# Patient Record
Sex: Male | Born: 1980 | Race: White | Hispanic: No | Marital: Married | Smoking: Never smoker
Health system: Southern US, Community
[De-identification: ages and names within clinical notes are randomized; demographics above are authoritative.]

## PROBLEM LIST (undated history)

## (undated) DIAGNOSIS — N200 Calculus of kidney: Secondary | ICD-10-CM

## (undated) HISTORY — PX: KNEE SURGERY: SHX244

---

## 2018-03-20 ENCOUNTER — Emergency Department: Payer: 59

## 2018-03-20 ENCOUNTER — Emergency Department
Admission: EM | Admit: 2018-03-20 | Discharge: 2018-03-20 | Disposition: A | Discharge status: Home / Self Care | Payer: 59 | Attending: Emergency Medicine | Admitting: Emergency Medicine

## 2018-03-20 ENCOUNTER — Encounter: Payer: Self-pay | Admitting: Emergency Medicine

## 2018-03-20 DIAGNOSIS — N200 Calculus of kidney: Secondary | ICD-10-CM

## 2018-03-20 DIAGNOSIS — R1012 Left upper quadrant pain: Secondary | ICD-10-CM | POA: Diagnosis present

## 2018-03-20 HISTORY — DX: Calculus of kidney: N20.0

## 2018-03-20 LAB — CBC
HEMATOCRIT: 41.6 % (ref 40.0–52.0)
HEMOGLOBIN: 14.8 g/dL (ref 13.0–18.0)
MCH: 30.4 pg (ref 26.0–34.0)
MCHC: 35.5 g/dL (ref 32.0–36.0)
MCV: 85.8 fL (ref 80.0–100.0)
Platelets: 326 10*3/uL (ref 150–440)
RBC: 4.85 MIL/uL (ref 4.40–5.90)
RDW: 12.7 % (ref 11.5–14.5)
WBC: 15.1 10*3/uL — ABNORMAL HIGH (ref 3.8–10.6)

## 2018-03-20 LAB — BASIC METABOLIC PANEL
ANION GAP: 10 (ref 5–15)
BUN: 16 mg/dL (ref 6–20)
CO2: 24 mmol/L (ref 22–32)
Calcium: 9.7 mg/dL (ref 8.9–10.3)
Chloride: 104 mmol/L (ref 101–111)
Creatinine, Ser: 1.23 mg/dL (ref 0.61–1.24)
Glucose, Bld: 112 mg/dL — ABNORMAL HIGH (ref 65–99)
POTASSIUM: 3.8 mmol/L (ref 3.5–5.1)
Sodium: 138 mmol/L (ref 135–145)

## 2018-03-20 LAB — URINALYSIS, COMPLETE (UACMP) WITH MICROSCOPIC
Bacteria, UA: NONE SEEN
Bilirubin Urine: NEGATIVE
GLUCOSE, UA: NEGATIVE mg/dL
Ketones, ur: 20 mg/dL — AB
Leukocytes, UA: NEGATIVE
Nitrite: NEGATIVE
PH: 7 (ref 5.0–8.0)
PROTEIN: 100 mg/dL — AB
Specific Gravity, Urine: 1.025 (ref 1.005–1.030)
Squamous Epithelial / LPF: NONE SEEN (ref 0–5)

## 2018-03-20 MED ORDER — MORPHINE SULFATE (PF) 2 MG/ML IV SOLN
2.0000 mg | Freq: Once | INTRAVENOUS | Status: AC
  Administered 2018-03-20: 2 mg via INTRAVENOUS
  Filled 2018-03-20: qty 1

## 2018-03-20 MED ORDER — OXYCODONE-ACETAMINOPHEN 5-325 MG PO TABS
2.0000 | ORAL_TABLET | Freq: Once | ORAL | Status: AC
  Administered 2018-03-20: 2 via ORAL
  Filled 2018-03-20: qty 2

## 2018-03-20 MED ORDER — KETOROLAC TROMETHAMINE 30 MG/ML IJ SOLN
30.0000 mg | Freq: Once | INTRAMUSCULAR | Status: AC
  Administered 2018-03-20: 30 mg via INTRAVENOUS
  Filled 2018-03-20: qty 1

## 2018-03-20 MED ORDER — FENTANYL CITRATE (PF) 100 MCG/2ML IJ SOLN
50.0000 ug | INTRAMUSCULAR | Status: AC | PRN
  Administered 2018-03-20 (×2): 50 ug via INTRAVENOUS
  Filled 2018-03-20 (×2): qty 2

## 2018-03-20 MED ORDER — SODIUM CHLORIDE 0.9 % IV SOLN
Freq: Once | INTRAVENOUS | Status: AC
  Administered 2018-03-20: 21:00:00 via INTRAVENOUS

## 2018-03-20 MED ORDER — ONDANSETRON 4 MG PO TBDP: 4.0000 mg | ORAL_TABLET | Freq: Three times a day (TID) | ORAL | 0 refills | Status: DC | PRN

## 2018-03-20 MED ORDER — OXYCODONE-ACETAMINOPHEN 7.5-325 MG PO TABS: 1.0000 | ORAL_TABLET | ORAL | 0 refills | Status: AC | PRN

## 2018-03-20 MED ORDER — TAMSULOSIN HCL 0.4 MG PO CAPS: 0.4000 mg | ORAL_CAPSULE | Freq: Every day | ORAL | 0 refills | Status: DC

## 2018-03-20 MED ORDER — HYDROMORPHONE HCL 1 MG/ML IJ SOLN
1.0000 mg | Freq: Once | INTRAMUSCULAR | Status: AC
  Administered 2018-03-20: 1 mg via INTRAVENOUS
  Filled 2018-03-20: qty 1

## 2018-03-20 MED ORDER — ONDANSETRON HCL 4 MG/2ML IJ SOLN
4.0000 mg | Freq: Once | INTRAMUSCULAR | Status: AC | PRN
  Administered 2018-03-20: 4 mg via INTRAVENOUS
  Filled 2018-03-20: qty 2

## 2018-03-20 NOTE — ED Provider Notes (Signed)
[facility] Emergency Department Provider Note       Time seen: ----------------------------------------- 8:59 PM on 03/20/2018 -----------------------------------------   I have reviewed the triage vital signs and the nursing notes.  HISTORY   Chief Complaint Flank Pain    HPI Dustin Klein is a 37 y.o. male with a history of kidney stones who presents to the ED for left flank pain that started 2 hours ago.  Patient has pain similar in the past from kidney stones.  He complains of dysuria.  He denies any other specific abdominal pain, has nausea but no vomiting or diarrhea.  Past Medical History:  Diagnosis Date  . Nephrolithiasis     There are no active problems to display for this patient.   History reviewed. No pertinent surgical history.  Allergies Patient has no known allergies.  Social History Social History   Tobacco Use  . Smoking status: Never Smoker  Substance Use Topics  . Alcohol use: Not Currently    Frequency: Never  . Drug use: Not Currently    Review of Systems Constitutional: Negative for fever. Cardiovascular: Negative for chest pain. Respiratory: Negative for shortness of breath. Gastrointestinal: Positive for flank pain Genitourinary: Positive for dysuria Musculoskeletal: Negative for back pain. Skin: Negative for rash. Neurological: Negative for headaches, focal weakness or numbness.  All systems negative/normal/unremarkable except as stated in the HPI  ____________________________________________   PHYSICAL EXAM:  VITAL SIGNS: ED Triage Vitals  Enc Vitals Group     BP --      Pulse --      Resp --      Temp --      Temp src --      SpO2 --      Weight 03/20/18 1805 150 lb (68 kg)     Height 03/20/18 1805 6' (1.829 m)     Head Circumference --      Peak Flow --      Pain Score 03/20/18 1847 10     Pain Loc --      Pain Edu? --      Excl. in GC? --    Constitutional: Alert and oriented.   Mild distress Eyes: Conjunctivae are normal. Normal extraocular movements. Cardiovascular: Normal rate, regular rhythm. No murmurs, rubs, or gallops. Respiratory: Normal respiratory effort without tachypnea nor retractions. Breath sounds are clear and equal bilaterally. No wheezes/rales/rhonchi. Gastrointestinal: Left flank tenderness, normal bowel sounds. Musculoskeletal: Nontender with normal range of motion in extremities. No lower extremity tenderness nor edema. Neurologic:  Normal speech and language. No gross focal neurologic deficits are appreciated.  Skin:  Skin is warm, dry and intact. No rash noted. Psychiatric: Mood and affect are normal. Speech and behavior are normal.  ____________________________________________  ED COURSE:  As part of my medical decision making, I reviewed the following data within the electronic MEDICAL RECORD NUMBER History obtained from family if available, nursing notes, old chart and ekg, as well as notes from prior ED visits. Patient presented for flank pain, we will assess with labs and imaging as indicated at this time.   Procedures ____________________________________________   LABS (pertinent positives/negatives)  Labs Reviewed  BASIC METABOLIC PANEL - Abnormal; Notable for the following components:      Result Value   Glucose, Bld 112 (*)    All other components within normal limits  CBC - Abnormal; Notable for the following components:   WBC 15.1 (*)    All other components within normal limits  URINALYSIS,  COMPLETE (UACMP) WITH MICROSCOPIC    RADIOLOGY  CT renal protocol IMPRESSION: 1. 1-2 mm stone at the left ureterovesicular junction causes mild left hydronephrosis. 2. No other acute abnormality.  No intrarenal stones. 3. Moderate increased stool in the rectum. Mild increased stool throughout the colon. No bowel inflammation.  ____________________________________________  DIFFERENTIAL DIAGNOSIS   Renal colic, UTI,  pyelonephritis, muscle strain  FINAL ASSESSMENT AND PLAN  Renal colic   Plan: The patient had presented for flank pain. Patient's labs were not concerning. Patient's imaging did reveal a distal left ureteral stone.  Pain is currently under control, he is stable for outpatient follow-up with pain medicine, antiemetics and Flomax.   Ulice Dash, MD   Note: This note was generated in part or whole with voice recognition software. Voice recognition is usually quite accurate but there are transcription errors that can and very often do occur. I apologize for any typographical errors that were not detected and corrected.     Emily Filbert, MD 03/20/18 2106

## 2018-03-20 NOTE — ED Triage Notes (Signed)
Pt arrived via pOV c/o left flank pain that started about 2 hours ago, similar to prior pain from kidney stones.   Pt c/o dysuria. Pt denies any abdominal pain.

## 2018-09-21 ENCOUNTER — Other Ambulatory Visit: Payer: Self-pay | Admitting: Family Medicine

## 2018-09-21 ENCOUNTER — Ambulatory Visit: Payer: Self-pay

## 2018-09-21 DIAGNOSIS — R0781 Pleurodynia: Secondary | ICD-10-CM

## 2020-05-21 ENCOUNTER — Other Ambulatory Visit: Payer: Self-pay

## 2020-05-21 ENCOUNTER — Emergency Department (HOSPITAL_COMMUNITY): Payer: 59

## 2020-05-21 ENCOUNTER — Emergency Department (HOSPITAL_COMMUNITY)
Admission: EM | Admit: 2020-05-21 | Discharge: 2020-05-21 | Disposition: A | Discharge status: Home / Self Care | Payer: 59 | Attending: Emergency Medicine | Admitting: Emergency Medicine

## 2020-05-21 DIAGNOSIS — U071 COVID-19: Secondary | ICD-10-CM | POA: Insufficient documentation

## 2020-05-21 DIAGNOSIS — J939 Pneumothorax, unspecified: Secondary | ICD-10-CM | POA: Diagnosis not present

## 2020-05-21 DIAGNOSIS — R509 Fever, unspecified: Secondary | ICD-10-CM | POA: Diagnosis present

## 2020-05-21 LAB — SARS CORONAVIRUS 2 BY RT PCR (HOSPITAL ORDER, PERFORMED IN ~~LOC~~ HOSPITAL LAB): SARS Coronavirus 2: POSITIVE — AB

## 2020-05-21 LAB — BASIC METABOLIC PANEL
Anion gap: 6 (ref 5–15)
BUN: 13 mg/dL (ref 6–20)
CO2: 27 mmol/L (ref 22–32)
Calcium: 8.6 mg/dL — ABNORMAL LOW (ref 8.9–10.3)
Chloride: 105 mmol/L (ref 98–111)
Creatinine, Ser: 0.95 mg/dL (ref 0.61–1.24)
GFR calc Af Amer: >60 mL/min (ref >60)
GFR calc non Af Amer: >60 mL/min (ref >60)
Glucose, Bld: 98 mg/dL (ref 70–99)
Potassium: 3.3 mmol/L — ABNORMAL LOW (ref 3.5–5.1)
Sodium: 138 mmol/L (ref 135–145)

## 2020-05-21 LAB — CBC WITH DIFFERENTIAL/PLATELET
Abs Immature Granulocytes: 0.04 10*3/uL (ref 0.00–0.07)
Basophils Absolute: 0 10*3/uL (ref 0.0–0.1)
Basophils Relative: 0 %
Eosinophils Absolute: 0 10*3/uL (ref 0.0–0.5)
Eosinophils Relative: 0 %
HCT: 38 % — ABNORMAL LOW (ref 39.0–52.0)
Hemoglobin: 13.1 g/dL (ref 13.0–17.0)
Immature Granulocytes: 1 %
Lymphocytes Relative: 13 %
Lymphs Abs: 0.7 10*3/uL (ref 0.7–4.0)
MCH: 30.1 pg (ref 26.0–34.0)
MCHC: 34.5 g/dL (ref 30.0–36.0)
MCV: 87.4 fL (ref 80.0–100.0)
Monocytes Absolute: 0.5 10*3/uL (ref 0.1–1.0)
Monocytes Relative: 9 %
Neutro Abs: 4 10*3/uL (ref 1.7–7.7)
Neutrophils Relative %: 77 %
Platelets: 212 10*3/uL (ref 150–400)
RBC: 4.35 MIL/uL (ref 4.22–5.81)
RDW: 12.1 % (ref 11.5–15.5)
WBC: 5.3 10*3/uL (ref 4.0–10.5)
nRBC: 0 % (ref 0.0–0.2)

## 2020-05-21 MED ORDER — SODIUM CHLORIDE (PF) 0.9 % IJ SOLN
INTRAMUSCULAR | Status: AC
  Filled 2020-05-21: qty 50

## 2020-05-21 MED ORDER — KETOROLAC TROMETHAMINE 30 MG/ML IJ SOLN
30.0000 mg | Freq: Once | INTRAMUSCULAR | Status: AC
  Administered 2020-05-21: 30 mg via INTRAMUSCULAR
  Filled 2020-05-21: qty 1

## 2020-05-21 MED ORDER — SODIUM CHLORIDE 0.9 % IV BOLUS
1000.0000 mL | Freq: Once | INTRAVENOUS | Status: AC
  Administered 2020-05-21: 1000 mL via INTRAVENOUS

## 2020-05-21 MED ORDER — FLUTICASONE PROPIONATE 50 MCG/ACT NA SUSP
2.0000 | Freq: Every day | NASAL | 0 refills | Status: DC
Start: 2020-05-21 — End: 2024-06-23

## 2020-05-21 MED ORDER — IOHEXOL 350 MG/ML SOLN
100.0000 mL | Freq: Once | INTRAVENOUS | Status: AC | PRN
  Administered 2020-05-21: 100 mL via INTRAVENOUS

## 2020-05-21 MED ORDER — BENZONATATE 100 MG PO CAPS: 100.0000 mg | ORAL_CAPSULE | Freq: Three times a day (TID) | ORAL | 0 refills | Status: DC

## 2020-05-21 NOTE — Discharge Instructions (Signed)
Tylenol and ibuprofen as needed for body aches  I have written you a medication for your cough.  It is important that you have a follow-up chest x-ray in 1 to 2 days to monitor the size of your pneumothorax.  If you have any new or worsening symptoms please seek reevaluation

## 2020-05-21 NOTE — ED Notes (Signed)
Patient back from CT.

## 2020-05-21 NOTE — ED Triage Notes (Addendum)
Pt to ED with c.o of sinus pressure/pain/congestion for 12 days but loss of smell/ taste/ and fever since yestereday. Pt fever since yesterday has been100 F and complaining of sob. Pt states has been coughing up lime green sputum for 12 days and also chest tightness. Pt was seen virtually at some point in the past 12 days placed on prednisone for 2 days and states felt better but since off has progressively gotten worse. Pt was diagnosed with sinusitis yesterday virtually and placed on Augmentin, but has not started Abx. Pt worried about pneumonia and decided to come here for evaluation .

## 2020-05-21 NOTE — ED Notes (Signed)
Patient transported to CT 

## 2020-05-21 NOTE — ED Provider Notes (Signed)
[facility]-EMERGENCY DEPT Provider Note   CSN: [id number] Arrival date & time: 05/21/20  [id number]    History Chief Complaint  Patient presents with  . Fever    Dustin NobleDerrick Klein is a 39 y.o. male with no significant past medical history who presents for evaluation of upper respiratory symptoms.  Patient states he had sinus pressure, congestion over the last 12 days.  States he is lost his sense of taste and smell yesterday.  Did telemedicine visit yesterday and placed on Augmentin.  Patient states he is now developed cough which was initially productive of green sputum however is no longer productive.  States he does get some chest tightness when he coughs.  Was originally placed on prednisone taper initial onset of symptoms which patient states resolved his symptoms however upon completion his symptoms returned.  States he has had generalized body aches and pains.  No Covid contacts that he knows of.  He did not receive the Covid vaccine.  Temp max 100.0 yesterday.  He has not taken anything for his symptoms.  He has not started on his Augmentin for sinusitis.  Denies lightheadedness, dizziness, sore throat, neck pain, neck stiffness, exertional chest pain or hemoptysis, abdominal pain, diarrhea, dysuria, lateral leg swelling, redness or warmth.  No prior history of PE or DVT.  No recent surgery or immobilization.  No known sick contacts.  History obtained from patient and past medical records.  No interpreter is used.  HPI     Past Medical History:  Diagnosis Date  . Nephrolithiasis     There are no problems to display for this patient.   No past surgical history on file.     No family history on file.  Social History   Tobacco Use  . Smoking status: Never Smoker  Vaping Use  . Vaping Use: Never used  Substance Use Topics  . Alcohol use: Not Currently  . Drug use: Not Currently    Home Medications Prior to Admission medications   Medication Sig Start  Date End Date Taking? Authorizing Provider  amoxicillin-clavulanate (AUGMENTIN) 875-125 MG tablet Take 1 tablet by mouth 2 (two) times daily. 05/20/20  Yes [provider]  benzonatate (TESSALON) 100 MG capsule Take 1 capsule (100 mg total) by mouth every 8 (eight) hours. 05/21/20   Yoshi Mancillas A, PA-C  fluticasone (FLONASE) 50 MCG/ACT nasal spray Place 2 sprays into both nostrils daily. 05/21/20   Nastassja Witkop A, PA-C  predniSONE (DELTASONE) 10 MG tablet Take 10-40 mg by mouth See admin instructions. Take 40mg  on day 1, then 20mg  on days 2-4 and 10mg  on day 5. Patient not taking: Reported on 05/21/2020 05/16/20   [provider]    Allergies    Morphine and related  Review of Systems   Review of Systems  Constitutional: Positive for activity change, appetite change, fatigue and fever. Negative for chills and diaphoresis.  HENT: Positive for congestion, postnasal drip, rhinorrhea and sinus pressure. Negative for ear pain, facial swelling, hearing loss, nosebleeds, sinus pain, sneezing, sore throat, trouble swallowing and voice change.   Respiratory: Negative.   Cardiovascular: Negative.   Gastrointestinal: Negative.   Genitourinary: Negative.   Musculoskeletal: Positive for myalgias. Negative for arthralgias, gait problem, neck pain and neck stiffness.  Skin: Negative.   Neurological: Positive for weakness (Generalized weakness).  All other systems reviewed and are negative.   Physical Exam Updated Vital Signs BP 123/84 (BP Location: Right Arm)   Pulse 68   Temp 99.5  F (37.5 C) (Oral)   Resp 17   Ht 6' (1.829 m)   Wt 70.3 kg   SpO2 100%   BMI 21.02 kg/m   Physical Exam Vitals and nursing note reviewed.  Constitutional:      General: He is not in acute distress.    Appearance: He is not ill-appearing, toxic-appearing or diaphoretic.  HENT:     Head: Normocephalic and atraumatic.     Jaw: There is normal jaw occlusion.     Right Ear: Tympanic  membrane, ear canal and external ear normal. There is no impacted cerumen. No hemotympanum. Tympanic membrane is not injected, scarred, perforated, erythematous, retracted or bulging.     Left Ear: Tympanic membrane, ear canal and external ear normal. There is no impacted cerumen. No hemotympanum. Tympanic membrane is not injected, scarred, perforated, erythematous, retracted or bulging.     Ears:     Comments: No Mastoid tenderness.    Nose:     Comments: Clear rhinorrhea and congestion to bilateral nares.  No sinus tenderness.    Mouth/Throat:     Comments: Posterior oropharynx clear.  Mucous membranes moist.  Tonsils without erythema or exudate.  Uvula midline without deviation.  No evidence of PTA or RPA.  No drooling, dysphasia or trismus.  Phonation normal. Neck:     Trachea: Trachea and phonation normal.     Meningeal: Brudzinski's sign and Kernig's sign absent.     Comments: No Neck stiffness or neck rigidity.  No meningismus.  No cervical lymphadenopathy. Cardiovascular:     Comments: No murmurs rubs or gallops. Pulmonary:     Comments: Clear to auscultation bilaterally without wheeze, rhonchi or rales.  No accessory muscle usage.  Able speak in full sentences. Abdominal:     Comments: Soft, nontender without rebound or guarding.  No CVA tenderness.  Musculoskeletal:     Comments: Moves all 4 extremities without difficulty.  Lower extremities without edema, erythema or warmth.  Skin:    Comments: Brisk capillary refill.  No rashes or lesions.  Neurological:     Mental Status: He is alert.     Comments: Ambulatory in department without difficulty.  Cranial nerves II through XII grossly intact.  No facial droop.  No aphasia.     ED Results / Procedures / Treatments   Labs (all labs ordered are listed, but only abnormal results are displayed) Labs Reviewed  SARS CORONAVIRUS 2 BY RT PCR (HOSPITAL ORDER, PERFORMED IN [facility]) - Abnormal; Notable for the following  components:      Result Value   SARS Coronavirus 2 POSITIVE (*)    All other components within normal limits  CBC WITH DIFFERENTIAL/PLATELET - Abnormal; Notable for the following components:   HCT 38.0 (*)    All other components within normal limits  BASIC METABOLIC PANEL - Abnormal; Notable for the following components:   Potassium 3.3 (*)    Calcium 8.6 (*)    All other components within normal limits    EKG EKG Interpretation  Date/Time:  Monday May 21 2020 06:56:51 EDT Ventricular Rate:  76 PR Interval:    QRS Duration: 103 QT Interval:  365 QTC Calculation: 411 R Axis:   96 Text Interpretation: Sinus rhythm Consider right ventricular hypertrophy ST elev, probable normal early repol pattern No old tracing to compare Confirmed by Meridee Score [phone]) on 05/21/2020 7:23:35 AM   Radiology CT Angio Chest PE W/Cm &/Or Wo Cm  Addendum Date: 05/21/2020   ADDENDUM  REPORT: 05/21/2020 12:13 ADDENDUM: There is an apparent minimal apical pneumothorax on the left, also appreciable on chest radiograph obtained earlier in the day. This information was communicated directly to referring provider. Electronically Signed   By: Lowella Grip III M.D.   On: 05/21/2020 12:13   Result Date: 05/21/2020 CLINICAL DATA:  Shortness of breath EXAM: CT ANGIOGRAPHY CHEST WITH CONTRAST TECHNIQUE: Multidetector CT imaging of the chest was performed using the standard protocol during bolus administration of intravenous contrast. Multiplanar CT image reconstructions and MIPs were obtained to evaluate the vascular anatomy. CONTRAST:  129mL OMNIPAQUE IOHEXOL 350 MG/ML SOLN COMPARISON:  Chest radiograph May 21, 2020 FINDINGS: Cardiovascular: There is no demonstrable pulmonary embolus. There is no thoracic aortic aneurysm. No dissection evident. Note that the contrast bolus within the aorta is not sufficient for confident dissection assessment. Visualized great vessels appear unremarkable. Note that the right  innominate and left common carotid arteries arise as a common trunk, an anatomic variant. There is no pericardial effusion or pericardial thickening. Mediastinum/Nodes: Visualized thyroid appears unremarkable. There are scattered subcentimeter lymph nodes without adenopathy evident by size criteria. No esophageal lesions are appreciable. Lungs/Pleura: There is patchy airspace opacity at multiple sites with the greatest degree of airspace opacity in each lower lobe but also present in portions of the right upper lobe and lingula. Several somewhat nodular appearing opacities are present in the lungs bilaterally which range in size from 4 mm to slightly greater than 1 cm. No cavitation evident. No pleural effusions are evident. Upper Abdomen: Visualized upper abdominal structures appear unremarkable. Musculoskeletal: No blastic or lytic bone lesions. No chest wall lesions are evident. Review of the MIP images confirms the above findings. IMPRESSION: 1. No demonstrable pulmonary embolus. No thoracic aortic aneurysm. No dissection evident. Note that the contrast bolus in the aorta is not sufficient for confident dissection assessment. 2. Multifocal pneumonia, most severe in the lower lobes. Multiple nodular opacities are seen bilaterally which may represent smaller foci of pneumonia. Atypical presentation of neoplasm possible; advise follow-up noncontrast enhanced CT examination after treatment for pneumonia. With respect to the appearance, correlation with COVID-19 status advised. Note that there is no appreciable pleural effusion or evidence of cavitation. 3.  No evident adenopathy. Electronically Signed: By: Lowella Grip III M.D. On: 05/21/2020 12:01   DG Chest Portable 1 View  Result Date: 05/21/2020 CLINICAL DATA:  COVID and shortness of breath EXAM: PORTABLE CHEST 1 VIEW COMPARISON:  09/21/2018 FINDINGS: Vague opacities in the left lung. Trace left apical pneumothorax. No edema, effusion, or pneumothorax.  Critical Value/emergent results were called by telephone at the time of interpretation on 05/21/2020 at 7:37 am to provider [facility] , who verbally acknowledged these results. IMPRESSION: 1. Suspect early infiltrates in the left lung. 2. Trace left apical pneumothorax. Electronically Signed   By: Monte Fantasia M.D.   On: 05/21/2020 07:37    Procedures Procedures (including critical care time)  Medications Ordered in ED Medications  ketorolac (TORADOL) 30 MG/ML injection 30 mg (30 mg Intramuscular Given 05/21/20 0657)  sodium chloride 0.9 % bolus 1,000 mL (0 mLs Intravenous Stopped 05/21/20 1010)  sodium chloride (PF) 0.9 % injection (  Given by Other 05/21/20 1151)  iohexol (OMNIPAQUE) 350 MG/ML injection 100 mL (100 mLs Intravenous Contrast Given 05/21/20 1144)    ED Course  I have reviewed the triage vital signs and the nursing notes.  Pertinent labs & imaging results that were available during my care of the patient were reviewed by  me and considered in my medical decision making (see chart for details).  39 year old male presents for evaluation of upper respiratory complaints.  He is afebrile, nonseptic, not ill-appearing here.  Did not receive Covid vaccine.  No known Covid exposures.  His heart and lungs are clear.  Does have some tenderness to his bilateral maxillary sinuses however no purulent discharge.  Abdomen soft, nontender.  He does have myalgias.  Patient's Covid test here is positive.  He does have some chest tightness and shortness of breath when he coughs however none while he is sitting.  Plan on chest x-ray, EKG.   EKG without STEMI  Chest x-ray with likely early infiltrates, possible trace left apical pneumothorax.  Discussed with radiology.  They recommend reimaging in 2 hours to assess for change. CBC without leukocytosis BMP with mild hypokalemia no additional abnormality COVID POSITIVE   Patient reassessed.  He continues have oxygen saturation 100% on room air  without tachycardia, tachypnea.  CT scan does show likely Covid pneumonia with minimal periapical pneumothorax on left.  Consult with Alycia Rossetti with CT surgery.  She recommends if patient is stable vital signs he may follow-up outpatient in 1 to 2 days for repeat chest x-ray.  She recommend strict return precautions.  Discussed imaging findings with patient.  He continues have stable vital signs.  Ambulatory with out hypoxia.  Discussed with medic management at home and close follow-up.  He will return for new or worsening symptoms.   The patient has been appropriately medically screened and/or stabilized in the ED. I have low suspicion for any other emergent medical condition which would require further screening, evaluation or treatment in the ED or require inpatient management.  Patient is hemodynamically stable and in no acute distress.  Patient able to ambulate in department prior to ED.  Evaluation does not show acute pathology that would require ongoing or additional emergent interventions while in the emergency department or further inpatient treatment.  I have discussed the diagnosis with the patient and answered all questions.  Pain is been managed while in the emergency department and patient has no further complaints prior to discharge.  Patient is comfortable with plan discussed in room and is stable for discharge at this time.  I have discussed strict return precautions for returning to the emergency department.  Patient was encouraged to follow-up with PCP/specialist refer to at discharge.   MDM Rules/Calculators/A&P                          Dustin Klein was evaluated in Emergency Department on 05/21/2020 for the symptoms described in the history of present illness. He was evaluated in the context of the global COVID-19 pandemic, which necessitated consideration that the patient might be at risk for infection with the SARS-CoV-2 virus that causes COVID-19. Institutional protocols and  algorithms that pertain to the evaluation of patients at risk for COVID-19 are in a state of rapid change based on information released by regulatory bodies including the CDC and federal and state organizations. These policies and algorithms were followed during the patient's care in the ED. Final Clinical Impression(s) / ED Diagnoses Final diagnoses:  COVID-19  Pneumothorax, unspecified type    Rx / DC Orders ED Discharge Orders         Ordered    benzonatate (TESSALON) 100 MG capsule  Every 8 hours     Discontinue  Reprint     05/21/20 1312    fluticasone (  FLONASE) 50 MCG/ACT nasal spray  Daily     Discontinue  Reprint     05/21/20 1312           Dmario Russom A, PA-C 05/21/20 1315    Terrilee Files, MD 05/21/20 1726

## 2020-05-23 ENCOUNTER — Emergency Department (HOSPITAL_COMMUNITY): Payer: 59

## 2020-05-23 ENCOUNTER — Encounter (HOSPITAL_COMMUNITY): Payer: Self-pay | Admitting: Emergency Medicine

## 2020-05-23 ENCOUNTER — Other Ambulatory Visit: Payer: Self-pay

## 2020-05-23 ENCOUNTER — Emergency Department (HOSPITAL_COMMUNITY)
Admission: EM | Admit: 2020-05-23 | Discharge: 2020-05-23 | Disposition: A | Discharge status: Home / Self Care | Payer: 59 | Attending: Emergency Medicine | Admitting: Emergency Medicine

## 2020-05-23 DIAGNOSIS — U071 COVID-19: Secondary | ICD-10-CM | POA: Diagnosis not present

## 2020-05-23 DIAGNOSIS — J1282 Pneumonia due to coronavirus disease 2019: Secondary | ICD-10-CM | POA: Insufficient documentation

## 2020-05-23 DIAGNOSIS — R05 Cough: Secondary | ICD-10-CM | POA: Diagnosis present

## 2020-05-23 MED ORDER — IBUPROFEN 800 MG PO TABS: 800.0000 mg | ORAL_TABLET | Freq: Three times a day (TID) | ORAL | 0 refills | Status: DC

## 2020-05-23 NOTE — ED Notes (Signed)
Discharge paperwork reviewed with pt. Pt with no questions or concerns at this time, pt ambulatory on room air to ED exit.

## 2020-05-23 NOTE — ED Provider Notes (Signed)
[facility]-EMERGENCY DEPT Provider Note   CSN: [id number] Arrival date & time: 05/23/20  0744     History Chief Complaint  Patient presents with  . Follow-up for + COVID    Dustin Klein is a 39 y.o. male.  HPI   Patient presented to the ED to follow-up on an abnormal chest x-ray he recently had.  Patient was seen in the emergency room 2 days ago for symptoms with cough, body aches and congestion.  Patient was diagnosed with Covid 19.  He also had an abnormality on his chest x-ray that suggested a pneumothorax.  This was confirmed with CT scan.  The pneumothorax was small, cardiothoracic surgery was consulted and they just recommended follow-up chest x-ray in 2 days without any further intervention.  Patient states he continues to cough.  He has aches and pains in his back and his chest.  He feels that he still having fevers.  He has been taking Tylenol and ibuprofen which tend to help with the symptoms return as far as his aches and pains.  His symptoms started about 2 weeks ago.  Past Medical History:  Diagnosis Date  . Nephrolithiasis     There are no problems to display for this patient.   History reviewed. No pertinent surgical history.     No family history on file.  Social History   Tobacco Use  . Smoking status: Never Smoker  Vaping Use  . Vaping Use: Never used  Substance Use Topics  . Alcohol use: Not Currently  . Drug use: Not Currently    Home Medications Prior to Admission medications   Medication Sig Start Date End Date Taking? Authorizing Provider  amoxicillin-clavulanate (AUGMENTIN) 875-125 MG tablet Take 1 tablet by mouth 2 (two) times daily. 05/20/20   [provider]  benzonatate (TESSALON) 100 MG capsule Take 1 capsule (100 mg total) by mouth every 8 (eight) hours. 05/21/20   Henderly, Britni A, PA-C  fluticasone (FLONASE) 50 MCG/ACT nasal spray Place 2 sprays into both nostrils daily. 05/21/20   Henderly, Britni A,  PA-C  ibuprofen (ADVIL) 800 MG tablet Take 1 tablet (800 mg total) by mouth 3 (three) times daily. 05/23/20   Linwood Dibbles, MD  predniSONE (DELTASONE) 10 MG tablet Take 10-40 mg by mouth See admin instructions. Take 40mg  on day 1, then 20mg  on days 2-4 and 10mg  on day 5. Patient not taking: Reported on 05/21/2020 05/16/20   [provider]    Allergies    Morphine and related  Review of Systems   Review of Systems  All other systems reviewed and are negative.   Physical Exam Updated Vital Signs BP 115/85 (BP Location: Left Arm)   Pulse (!) 57   Temp 98.2 F (36.8 C) (Oral)   Resp 16   Ht 1.829 m (6')   Wt 70.3 kg   SpO2 99%   BMI 21.02 kg/m   Physical Exam Vitals and nursing note reviewed.  Constitutional:      General: He is not in acute distress.    Appearance: He is well-developed.  HENT:     Head: Normocephalic and atraumatic.     Right Ear: External ear normal.     Left Ear: External ear normal.  Eyes:     General: No scleral icterus.       Right eye: No discharge.        Left eye: No discharge.     Conjunctiva/sclera: Conjunctivae normal.  Neck:  Trachea: No tracheal deviation.  Cardiovascular:     Rate and Rhythm: Normal rate and regular rhythm.  Pulmonary:     Effort: Pulmonary effort is normal. No respiratory distress.     Breath sounds: Normal breath sounds. No stridor. No wheezing or rales.  Abdominal:     General: Bowel sounds are normal. There is no distension.     Palpations: Abdomen is soft.     Tenderness: There is no abdominal tenderness. There is no guarding or rebound.  Musculoskeletal:        General: No swelling, tenderness or deformity.     Cervical back: Neck supple.  Skin:    General: Skin is warm and dry.     Findings: No rash.  Neurological:     Mental Status: He is alert.     Cranial Nerves: Cranial nerve deficit: no gross deficits.     Sensory: No sensory deficit.     Motor: No abnormal muscle tone or seizure activity.       Coordination: Coordination normal.     ED Results / Procedures / Treatments   Labs (all labs ordered are listed, but only abnormal results are displayed) Labs Reviewed - No data to display  EKG None  Radiology DG Chest 2 View  Result Date: 05/23/2020 CLINICAL DATA:  COVID positive, shortness of breath EXAM: CHEST - 2 VIEW COMPARISON:  05/21/2020 FINDINGS: The heart size and mediastinal contours are within normal limits. No significant change in scattered bilateral heterogeneous airspace opacities, most conspicuous in the left midlung. The visualized skeletal structures are unremarkable. IMPRESSION: No significant change in scattered bilateral heterogeneous airspace opacities, most conspicuous in the left midlung, consistent with multifocal infection and COVID-19 diagnosis. Electronically Signed   By: Lauralyn Primes M.D.   On: 05/23/2020 08:41   CT Angio Chest PE W/Cm &/Or Wo Cm  Addendum Date: 05/21/2020   ADDENDUM REPORT: 05/21/2020 12:13 ADDENDUM: There is an apparent minimal apical pneumothorax on the left, also appreciable on chest radiograph obtained earlier in the day. This information was communicated directly to referring provider. Electronically Signed   By: Bretta Bang III M.D.   On: 05/21/2020 12:13   Result Date: 05/21/2020 CLINICAL DATA:  Shortness of breath EXAM: CT ANGIOGRAPHY CHEST WITH CONTRAST TECHNIQUE: Multidetector CT imaging of the chest was performed using the standard protocol during bolus administration of intravenous contrast. Multiplanar CT image reconstructions and MIPs were obtained to evaluate the vascular anatomy. CONTRAST:  OMNIPAQUE IOHEXOL 350 MG/ML SOLN COMPARISON:  Chest radiograph May 21, 2020 FINDINGS: Cardiovascular: There is no demonstrable pulmonary embolus. There is no thoracic aortic aneurysm. No dissection evident. Note that the contrast bolus within the aorta is not sufficient for confident dissection assessment. Visualized great  vessels appear unremarkable. Note that the right innominate and left common carotid arteries arise as a common trunk, an anatomic variant. There is no pericardial effusion or pericardial thickening. Mediastinum/Nodes: Visualized thyroid appears unremarkable. There are scattered subcentimeter lymph nodes without adenopathy evident by size criteria. No esophageal lesions are appreciable. Lungs/Pleura: There is patchy airspace opacity at multiple sites with the greatest degree of airspace opacity in each lower lobe but also present in portions of the right upper lobe and lingula. Several somewhat nodular appearing opacities are present in the lungs bilaterally which range in size from 4 mm to slightly greater than 1 cm. No cavitation evident. No pleural effusions are evident. Upper Abdomen: Visualized upper abdominal structures appear unremarkable. Musculoskeletal: No blastic or lytic bone  lesions. No chest wall lesions are evident. Review of the MIP images confirms the above findings. IMPRESSION: 1. No demonstrable pulmonary embolus. No thoracic aortic aneurysm. No dissection evident. Note that the contrast bolus in the aorta is not sufficient for confident dissection assessment. 2. Multifocal pneumonia, most severe in the lower lobes. Multiple nodular opacities are seen bilaterally which may represent smaller foci of pneumonia. Atypical presentation of neoplasm possible; advise follow-up noncontrast enhanced CT examination after treatment for pneumonia. With respect to the appearance, correlation with COVID-19 status advised. Note that there is no appreciable pleural effusion or evidence of cavitation. 3.  No evident adenopathy. Electronically Signed: By: Bretta Bang III M.D. On: 05/21/2020 12:01    Procedures Procedures (including critical care time)  Medications Ordered in ED Medications - No data to display  ED Course  I have reviewed the triage vital signs and the nursing notes.  Pertinent labs  & imaging results that were available during my care of the patient were reviewed by me and considered in my medical decision making (see chart for details).    MDM Rules/Calculators/A&P                          Patient's repeat chest x-ray does not show evidence of pneumothorax per radiology.  This is either resolved or is small enough that is not visible on chest x-ray at this time.  Regarding the patient's Covid, he is well outside any treatment window.  Fortunately he is not hypoxic.  No indications for hospitalization.  Recommend continued supportive care.  Colbe Viviano was evaluated in Emergency Department on 05/23/2020 for the symptoms described in the history of present illness. He was evaluated in the context of the global COVID-19 pandemic, which necessitated consideration that the patient might be at risk for infection with the SARS-CoV-2 virus that causes COVID-19. Institutional protocols and algorithms that pertain to the evaluation of patients at risk for COVID-19 are in a state of rapid change based on information released by regulatory bodies including the CDC and federal and state organizations. These policies and algorithms were followed during the patient's care in the ED.  Final Clinical Impression(s) / ED Diagnoses Final diagnoses:  Pneumonia due to COVID-19 virus    Rx / DC Orders ED Discharge Orders         Ordered    ibuprofen (ADVIL) 800 MG tablet  3 times daily     Discontinue  Reprint     05/23/20 0950           Linwood Dibbles, MD 05/23/20 1000

## 2020-05-23 NOTE — Discharge Instructions (Addendum)
Continue the medications to help with your covid symptoms.  Take the ibuprofen to help with the aches and pains.  You can continue to take tylenol.   The ptx that you had previously, the radiologist did not see today on xray.  That should continue to heal on its own.

## 2020-05-23 NOTE — ED Triage Notes (Signed)
Patient presents for a follow-up regarding his + CVOID results for another CXR, states he was told to come back to the ED in 2 days. Endorses cough, SOB. States he has been taking his medications as prescribed.

## 2021-09-29 IMAGING — CR DG CHEST 2V
2 series · 2 of 2 positions shown · non-contrast
Comparison: 05/21/2020

CLINICAL DATA: COVID positive, shortness of breath

EXAM:
CHEST - 2 VIEW

[w chest pa]
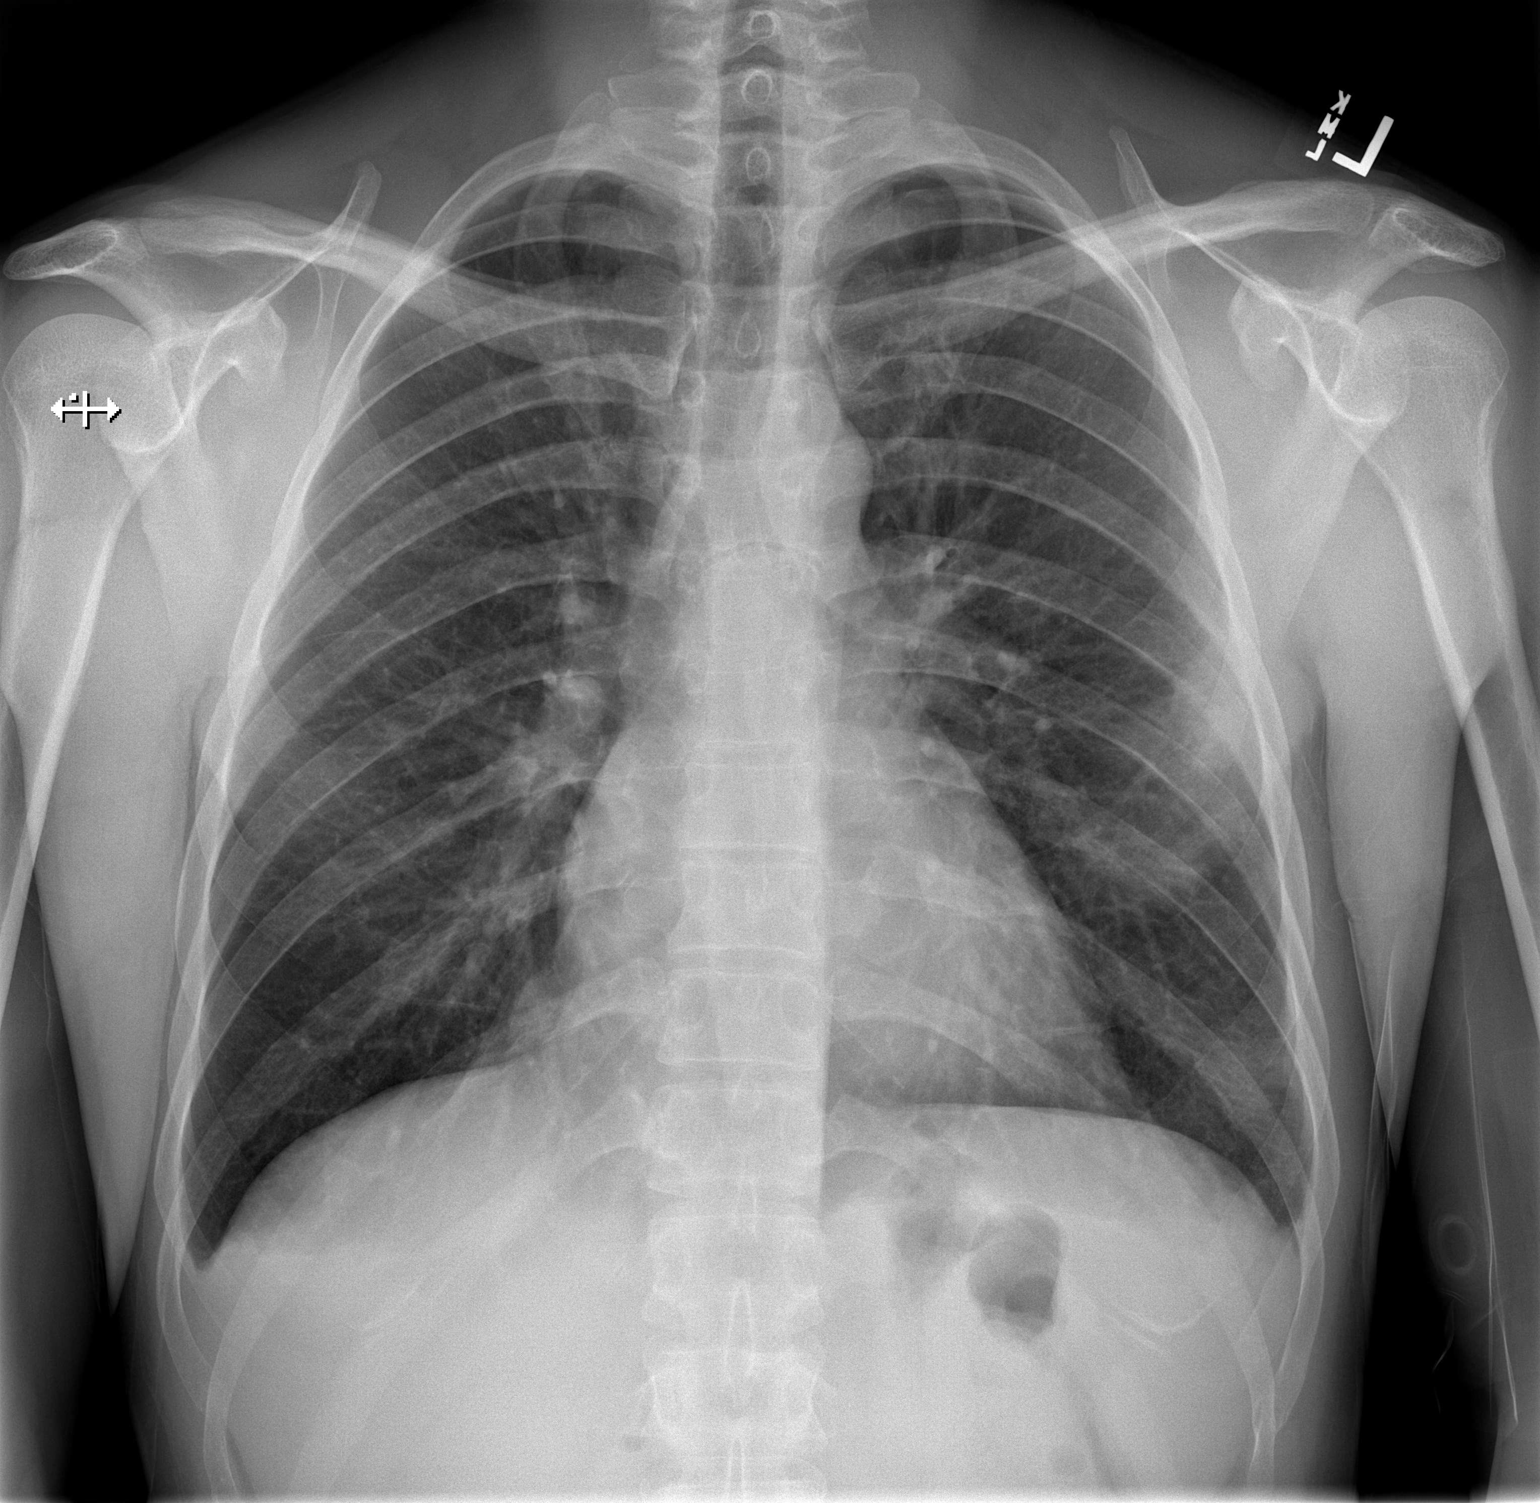

[w chest lat]
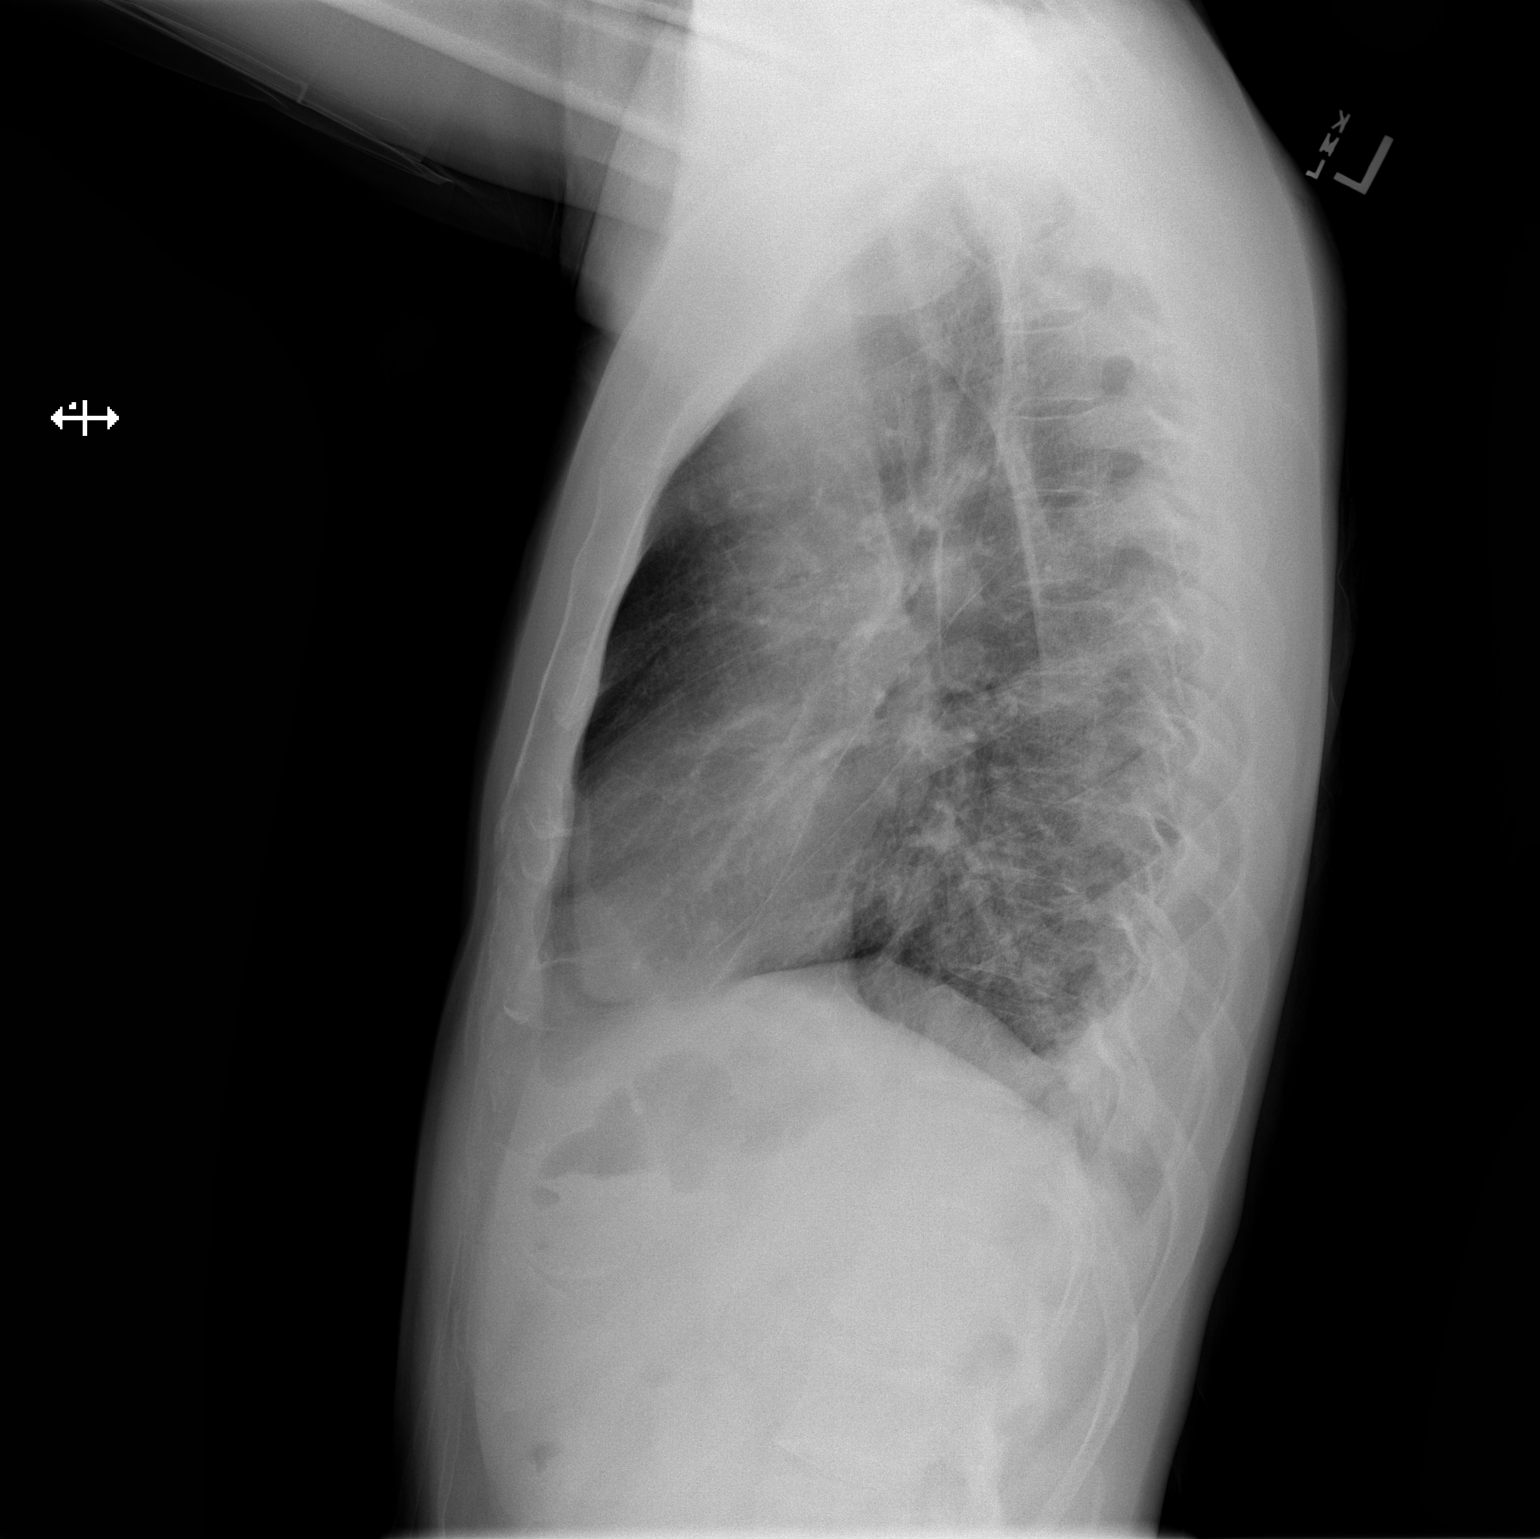

[2 of 2 positions shown; findings below may reference images not displayed]

FINDINGS: The heart size and mediastinal contours are within normal limits. No
significant change in scattered bilateral heterogeneous airspace
opacities, most conspicuous in the left midlung. The visualized
skeletal structures are unremarkable.
IMPRESSION: No significant change in scattered bilateral heterogeneous airspace
opacities, most conspicuous in the left midlung, consistent with
multifocal infection and MTRPV-IK diagnosis.

## 2022-07-24 ENCOUNTER — Other Ambulatory Visit: Payer: Self-pay | Admitting: Neurosurgery

## 2022-07-24 DIAGNOSIS — M542 Cervicalgia: Secondary | ICD-10-CM

## 2022-07-30 ENCOUNTER — Ambulatory Visit
Admission: RE | Admit: 2022-07-30 | Discharge: 2022-07-30 | Disposition: A | Discharge status: Home / Self Care | Payer: 59 | Source: Ambulatory Visit | Attending: Neurosurgery | Admitting: Neurosurgery

## 2022-07-30 DIAGNOSIS — M542 Cervicalgia: Secondary | ICD-10-CM

## 2022-07-30 MED ORDER — ONDANSETRON HCL 4 MG/2ML IJ SOLN: 4.0000 mg | Freq: Once | INTRAMUSCULAR | Status: DC | PRN

## 2022-07-30 MED ORDER — IOPAMIDOL (ISOVUE-M 300) INJECTION 61%
10.0000 mL | Freq: Once | INTRAMUSCULAR | Status: AC
  Administered 2022-07-30: 10 mL via INTRATHECAL

## 2022-07-30 MED ORDER — MEPERIDINE HCL 50 MG/ML IJ SOLN: 50.0000 mg | Freq: Once | INTRAMUSCULAR | Status: DC | PRN

## 2022-07-30 MED ORDER — DIAZEPAM 5 MG PO TABS: 10.0000 mg | ORAL_TABLET | Freq: Once | ORAL | Status: DC

## 2022-07-30 NOTE — Discharge Instructions (Signed)

## 2022-08-01 ENCOUNTER — Telehealth: Payer: Self-pay

## 2022-08-01 NOTE — Telephone Encounter (Signed)
Pt called into [facility] with c/o headache post myelogram procedure on 07/30/22. Pt describes his headache as a severe ache that gets better when lying flat. I advised the patient to lie flat for 24 hours, only getting up about 10 minutes at a time to eat and use the bathroom. Pt advised if he still had a headache after 24 hours, to lie flat another 24 hours, if the headache still persisted, to call our office and his referring MD to let them know. Pt also advised to drink lots of fluids and caffeine if he tolerates it. Pt advised if any new or worsening symptom arises over the weekend to seek medical attention. Pt verbalized understanding.

## 2022-08-04 ENCOUNTER — Ambulatory Visit
Admission: RE | Admit: 2022-08-04 | Discharge: 2022-08-04 | Disposition: A | Discharge status: Home / Self Care | Payer: 59 | Source: Ambulatory Visit | Attending: Neurosurgery | Admitting: Neurosurgery

## 2022-08-04 ENCOUNTER — Other Ambulatory Visit: Payer: Self-pay | Admitting: Neurosurgery

## 2022-08-04 DIAGNOSIS — G971 Other reaction to spinal and lumbar puncture: Secondary | ICD-10-CM

## 2022-08-04 MED ORDER — IOPAMIDOL (ISOVUE-M 200) INJECTION 41%
1.0000 mL | Freq: Once | INTRAMUSCULAR | Status: AC
  Administered 2022-08-04: 1 mL via EPIDURAL

## 2022-08-04 NOTE — Discharge Instructions (Signed)

## 2023-06-25 ENCOUNTER — Ambulatory Visit: Payer: 59 | Admitting: Physician Assistant

## 2023-08-01 DIAGNOSIS — R079 Chest pain, unspecified: Secondary | ICD-10-CM | POA: Diagnosis not present

## 2024-06-17 DIAGNOSIS — M5412 Radiculopathy, cervical region: Secondary | ICD-10-CM | POA: Insufficient documentation

## 2024-06-17 DIAGNOSIS — G971 Other reaction to spinal and lumbar puncture: Secondary | ICD-10-CM | POA: Insufficient documentation
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
## 2024-06-23 ENCOUNTER — Ambulatory Visit: Admitting: Orthopedic Surgery

## 2024-06-23 ENCOUNTER — Encounter: Payer: Self-pay | Admitting: Orthopedic Surgery

## 2024-06-23 VITALS — BP 130/90 | Ht 71.0 in | Wt 152.8 lb

## 2024-06-23 DIAGNOSIS — M47812 Spondylosis without myelopathy or radiculopathy, cervical region: Secondary | ICD-10-CM

## 2024-06-23 DIAGNOSIS — Y9317 Activity, water skiing and wake boarding: Secondary | ICD-10-CM | POA: Diagnosis not present

## 2024-06-23 DIAGNOSIS — S134XXS Sprain of ligaments of cervical spine, sequela: Secondary | ICD-10-CM

## 2024-06-23 DIAGNOSIS — M5412 Radiculopathy, cervical region: Secondary | ICD-10-CM

## 2024-06-23 DIAGNOSIS — M542 Cervicalgia: Secondary | ICD-10-CM

## 2024-06-23 DIAGNOSIS — M4802 Spinal stenosis, cervical region: Secondary | ICD-10-CM | POA: Diagnosis not present

## 2024-06-23 MED ORDER — METHOCARBAMOL 500 MG PO TABS: 500.0000 mg | ORAL_TABLET | Freq: Three times a day (TID) | ORAL | 0 refills | Status: AC | PRN

## 2024-06-23 NOTE — Patient Instructions (Signed)
 It was so nice to see you today. Thank you so much for coming in.    You have some wear and tear in your neck from your previous imaging in 2023.   I want to get an MRI of your neck to look into things further. We will get this approved through your insurance and [facility] will call you to schedule the appointment. Ask about your patient responsibility. You do not need to pay this prior to getting MRI, they can bill you.   Let me know when the MRI is done so I can get the images/results.   Once I have the results, we will call you to schedule a follow up visit with me to review them.   I sent a prescription for methocarbamol  to help with muscle spasms. Use only as needed and be careful, this can make you sleepy.   Please do not hesitate to call if you have any questions or concerns. You can also message me in MyChart.   Glade Boys PA-C [phone]     The physicians and staff at [facility] are committed to providing excellent care. You may receive a survey asking for feedback about your experience at our office. We value you your feedback and appreciate you taking the time to to fill it out. The [facility] leadership team is also available to discuss your experience in person, feel free to contact us  [phone].

## 2024-07-08 ENCOUNTER — Telehealth: Payer: Self-pay

## 2024-07-08 NOTE — Telephone Encounter (Signed)
 Called [facility] at [phone] to follow up on MRI appointment, patient had an appointment on 07/05/24 but was a no show. I called and spoke to Mrs Koziol (patient's wife-ok per DPR on file), she advised me that patient did miss that appointment due to his son just tragically died and he is planning his funeral. I gave my condolences and advised them to take their time and when he was ready to proceed with this to call scheduling at above number but of course this is not a rush. Advised to let us  know if there was anything we can do to help them in the meantime.   I did tell Mrs Wessinger that once this scan was completed we would need a disc with images to review as it would not pull over in the system. FYI to [employer]

## 2024-07-15 ENCOUNTER — Other Ambulatory Visit: Payer: Self-pay

## 2024-07-15 ENCOUNTER — Inpatient Hospital Stay
Admission: RE | Admit: 2024-07-15 | Discharge: 2024-07-15 | Disposition: A | Discharge status: Home / Self Care | Payer: Self-pay | Source: Ambulatory Visit | Attending: Orthopedic Surgery | Admitting: Orthopedic Surgery

## 2024-07-15 DIAGNOSIS — Z049 Encounter for examination and observation for unspecified reason: Secondary | ICD-10-CM
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
## 2024-07-18 ENCOUNTER — Ambulatory Visit (INDEPENDENT_AMBULATORY_CARE_PROVIDER_SITE_OTHER): Admitting: Orthopedic Surgery

## 2024-07-18 ENCOUNTER — Encounter: Payer: Self-pay | Admitting: Orthopedic Surgery

## 2024-07-18 DIAGNOSIS — M47812 Spondylosis without myelopathy or radiculopathy, cervical region: Secondary | ICD-10-CM | POA: Diagnosis not present

## 2024-07-18 DIAGNOSIS — M5412 Radiculopathy, cervical region: Secondary | ICD-10-CM

## 2024-07-18 DIAGNOSIS — Y9317 Activity, water skiing and wake boarding: Secondary | ICD-10-CM | POA: Diagnosis not present

## 2024-07-18 DIAGNOSIS — S134XXS Sprain of ligaments of cervical spine, sequela: Secondary | ICD-10-CM | POA: Diagnosis not present

## 2024-07-18 DIAGNOSIS — M4802 Spinal stenosis, cervical region: Secondary | ICD-10-CM | POA: Diagnosis not present

## 2024-08-31 ENCOUNTER — Encounter: Payer: Self-pay | Admitting: Orthopedic Surgery
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
## 2024-09-02 ENCOUNTER — Telehealth: Admitting: Orthopedic Surgery
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
## 2024-09-05 ENCOUNTER — Encounter: Admitting: Orthopedic Surgery
